# Patient Record
Sex: Male | Born: 2000
Health system: Southern US, Community
[De-identification: ages and names within clinical notes are randomized; demographics above are authoritative.]

## PROBLEM LIST (undated history)

## (undated) DIAGNOSIS — F909 Attention-deficit hyperactivity disorder, unspecified type: Secondary | ICD-10-CM

## (undated) HISTORY — PX: OTHER SURGICAL HISTORY: SHX169

## (undated) HISTORY — PX: WRIST SURGERY: SHX841

---
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
## 2020-06-14 IMAGING — CR DG ABD 2 VIEWS
2 series · 2 of 2 positions shown · non-contrast
Comparison: None.

CLINICAL DATA: Abdominal pain and vomiting for the past 4 days.

EXAM:
ABDOMEN - 2 VIEW

[w abdomen upright]
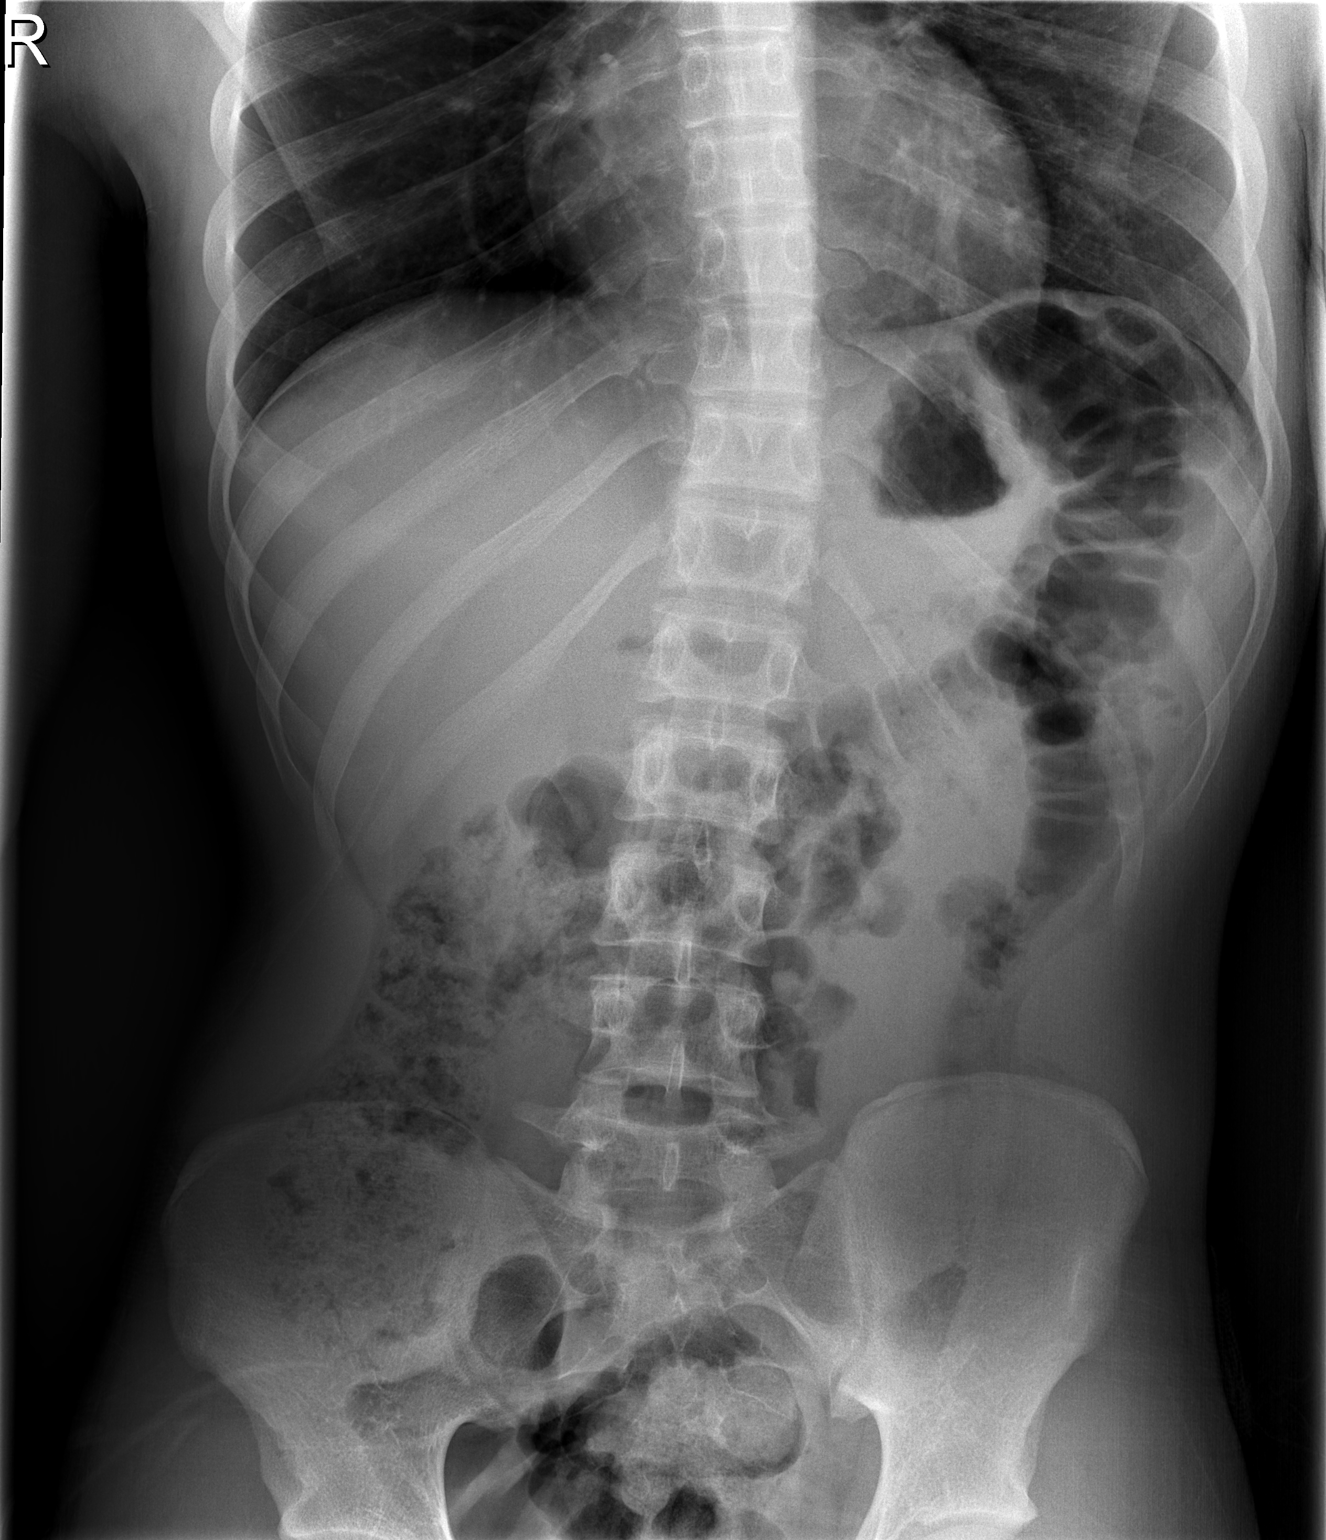

[t abdomen supine]
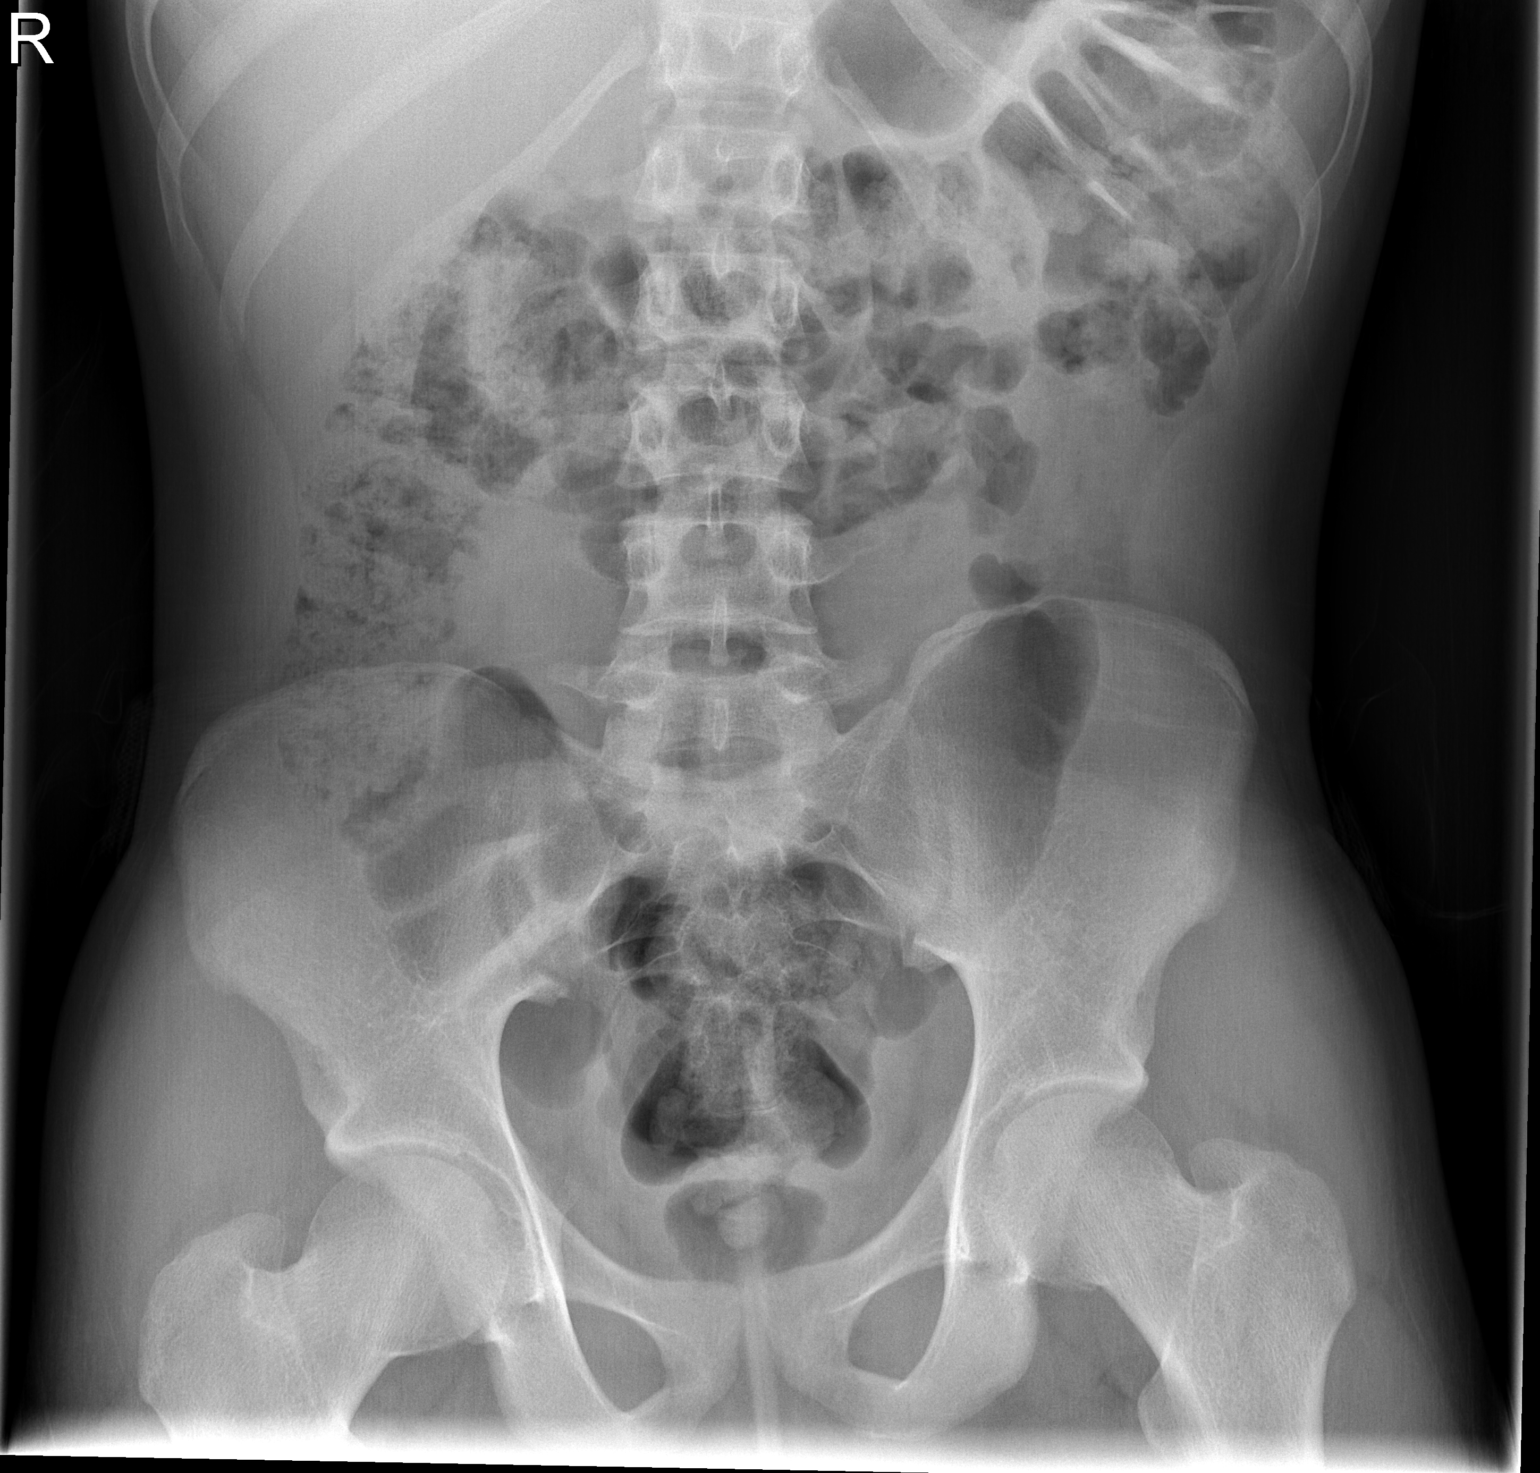

[2 of 2 positions shown; findings below may reference images not displayed]

FINDINGS: The colonic stool burden is mildly increased. There is no small
bowel obstructive pattern. No free extraluminal gas is observed.
There is some gas and stool in the rectum. There are no abnormal
soft tissue calcifications. There is gentle curvature centered at
the thoracolumbar junction which may be positional.
IMPRESSION: Moderately increase colonic stool burden may reflect constipation in
the appropriate clinical setting. No evidence of obstruction.
